# Patient Record
Sex: Male | Born: 1965 | Race: White | Hispanic: No | Marital: Married | Smoking: Never smoker
Health system: Southern US, Community
[De-identification: ages and names within clinical notes are randomized; demographics above are authoritative.]

## PROBLEM LIST (undated history)

## (undated) DIAGNOSIS — E119 Type 2 diabetes mellitus without complications: Secondary | ICD-10-CM

## (undated) DIAGNOSIS — E7800 Pure hypercholesterolemia, unspecified: Secondary | ICD-10-CM

## (undated) HISTORY — PX: JOINT REPLACEMENT: SHX530

---

## 2014-04-28 ENCOUNTER — Emergency Department (HOSPITAL_BASED_OUTPATIENT_CLINIC_OR_DEPARTMENT_OTHER)
Admission: EM | Admit: 2014-04-28 | Discharge: 2014-04-28 | Disposition: A | Discharge status: Home / Self Care | Payer: BLUE CROSS/BLUE SHIELD | Attending: Emergency Medicine | Admitting: Emergency Medicine

## 2014-04-28 DIAGNOSIS — Y998 Other external cause status: Secondary | ICD-10-CM | POA: Diagnosis not present

## 2014-04-28 DIAGNOSIS — Z79899 Other long term (current) drug therapy: Secondary | ICD-10-CM | POA: Insufficient documentation

## 2014-04-28 DIAGNOSIS — Z794 Long term (current) use of insulin: Secondary | ICD-10-CM | POA: Diagnosis not present

## 2014-04-28 DIAGNOSIS — Y9389 Activity, other specified: Secondary | ICD-10-CM | POA: Diagnosis not present

## 2014-04-28 DIAGNOSIS — Y9289 Other specified places as the place of occurrence of the external cause: Secondary | ICD-10-CM | POA: Diagnosis not present

## 2014-04-28 DIAGNOSIS — S0181XA Laceration without foreign body of other part of head, initial encounter: Secondary | ICD-10-CM | POA: Diagnosis not present

## 2014-04-28 MED ORDER — LIDOCAINE-EPINEPHRINE 2 %-1:100000 IJ SOLN
20.0000 mL | Freq: Once | INTRAMUSCULAR | Status: AC
  Administered 2014-04-28: 20 mL via INTRADERMAL

## 2014-04-28 MED ORDER — LIDOCAINE-EPINEPHRINE 1 %-1:100000 IJ SOLN
INTRAMUSCULAR | Status: AC
  Filled 2014-04-28: qty 1

## 2014-04-28 MED ORDER — CEPHALEXIN 500 MG PO CAPS: 500.0000 mg | ORAL_CAPSULE | Freq: Four times a day (QID) | ORAL | Status: DC

## 2014-04-28 NOTE — ED Notes (Signed)
Patient here with laceration to left side of face after brake bar hit patient in head, no loc, no bleeding, arrived with steri-strips to same

## 2014-04-28 NOTE — Discharge Instructions (Signed)
Keep wound dry and do not remove dressing for 24 hours if possible. After that, wash gently morning and night (every 12 hours) with soap and water. Use a topical antibiotic ointment and cover with a bandaid or gauze.    Do NOT use rubbing alcohol or hydrogen peroxide, do not soak the area   Present to your primary care doctor or the urgent care of your choice, or the ED for suture removal in 7-10 days.   Every attempt was made to remove foreign body (contaminants) from the wound.  However, there is always a chance that some may remain in the wound. This can  increase your risk of infection.   If you see signs of infection (warmth, redness, tenderness, pus, sharp increase in pain, fever Facial Laceration  A facial laceration is a cut on the face. These injuries can be painful and cause bleeding. Lacerations usually heal quickly, but they need special care to reduce scarring. DIAGNOSIS  Your health care provider will take a medical history, ask for details about how the injury occurred, and examine the wound to determine how deep the cut is. TREATMENT  Some facial lacerations may not require closure. Others may not be able to be closed because of an increased risk of infection. The risk of infection and the chance for successful closure will depend on various factors, including the amount of time since the injury occurred. The wound may be cleaned to help prevent infection. If closure is appropriate, pain medicines may be given if needed. Your health care provider will use stitches (sutures), wound glue (adhesive), or skin adhesive strips to repair the laceration. These tools bring the skin edges together to allow for faster healing and a better cosmetic outcome. If needed, you may also be given a tetanus shot. HOME CARE INSTRUCTIONS  Only take over-the-counter or prescription medicines as directed by your health care provider.  Follow your health care provider's instructions for wound care. These  instructions will vary depending on the technique used for closing the wound. For Sutures:  Keep the wound clean and dry.   If you were given a bandage (dressing), you should change it at least once a day. Also change the dressing if it becomes wet or dirty, or as directed by your health care provider.   Wash the wound with soap and water 2 times a day. Rinse the wound off with water to remove all soap. Pat the wound dry with a clean towel.   After cleaning, apply a thin layer of the antibiotic ointment recommended by your health care provider. This will help prevent infection and keep the dressing from sticking.   You may shower as usual after the first 24 hours. Do not soak the wound in water until the sutures are removed.   Get your sutures removed as directed by your health care provider. With facial lacerations, sutures should usually be taken out after 4-5 days to avoid stitch marks.   Wait a few days after your sutures are removed before applying any makeup. For Skin Adhesive Strips:  Keep the wound clean and dry.   Do not get the skin adhesive strips wet. You may bathe carefully, using caution to keep the wound dry.   If the wound gets wet, pat it dry with a clean towel.   Skin adhesive strips will fall off on their own. You may trim the strips as the wound heals. Do not remove skin adhesive strips that are still stuck to the  wound. They will fall off in time.  For Wound Adhesive:  You may briefly wet your wound in the shower or bath. Do not soak or scrub the wound. Do not swim. Avoid periods of heavy sweating until the skin adhesive has fallen off on its own. After showering or bathing, gently pat the wound dry with a clean towel.   Do not apply liquid medicine, cream medicine, ointment medicine, or makeup to your wound while the skin adhesive is in place. This may loosen the film before your wound is healed.   If a dressing is placed over the wound, be careful not  to apply tape directly over the skin adhesive. This may cause the adhesive to be pulled off before the wound is healed.   Avoid prolonged exposure to sunlight or tanning lamps while the skin adhesive is in place.  The skin adhesive will usually remain in place for 5-10 days, then naturally fall off the skin. Do not pick at the adhesive film.  After Healing: Once the wound has healed, cover the wound with sunscreen during the day for 1 full year. This can help minimize scarring. Exposure to ultraviolet light in the first year will darken the scar. It can take 1-2 years for the scar to lose its redness and to heal completely.  SEEK IMMEDIATE MEDICAL CARE IF:  You have redness, pain, or swelling around the wound.   You see ayellowish-white fluid (pus) coming from the wound.   You have chills or a fever.  MAKE SURE YOU:  Understand these instructions.  Will watch your condition.  Will get help right away if you are not doing well or get worse. Document Released: 04/22/2004 Document Revised: 01/03/2013 Document Reviewed: 10/26/2012 [facility] Patient Information 2015 [place], [place]. This information is not intended to replace advice given to you by your health care provider. Make sure you discuss any questions you have with your health care provider. , red streaking in the skin) immediately return to the emergency department.   After the wound heals fully, apply sunscreen for 6-12 months to minimize scarring.

## 2014-04-28 NOTE — ED Provider Notes (Signed)
CSN: [id number]     Arrival date & time 04/28/14  1745 History   First MD Initiated Contact with Patient 04/28/14 1802     Chief Complaint  Patient presents with  . laceration to face      (Consider location/radiation/quality/duration/timing/severity/associated sxs/prior Treatment) HPI   Richard Bernard is a 49 y.o. male complaining of laceration to face. He states he was working on a car when a pry bar came off under pressure and hit him with a glancing blow to the left side of his face. He states the metal was clean and not rusty. He denies LOC, blurred vision, pain with eye movement, or tenderness around the orbit. He states that the laceration bled immediately but was controlled with pressure and ice. He attempted to clean the wound with water and an antibiotic ointment. He also applied antibiotic ointment and butterfly bandages before being evaluated. He comes in concerned about cleaning and closure of the wound. He denies any H/A, nausea, vomiting, blurred vision, or dizziness. He states he is UTD on tetanus.  No past medical history on file. No past surgical history on file. No family history on file. History  Substance Use Topics  . Smoking status: Not on file  . Smokeless tobacco: Not on file  . Alcohol Use: Not on file    Review of Systems  10 systems reviewed and found to be negative, except as noted in the HPI.   Allergies  Review of patient's allergies indicates no known allergies.  Home Medications   Prior to Admission medications   Medication Sig Start Date End Date Taking? Authorizing Provider  atorvastatin (LIPITOR) 20 MG tablet Take 20 mg by mouth daily.   Yes Historical Provider, MD  insulin aspart (NOVOLOG) 100 UNIT/ML injection Inject into the skin 3 (three) times daily before meals.   Yes Historical Provider, MD   BP 144/105 mmHg  Pulse 71  Temp(Src) 98.3 F (36.8 C) (Oral)  Resp 18  Wt 170 lb (77.111 kg)  SpO2 98% Physical Exam  Constitutional: He is  oriented to person, place, and time. He appears well-developed and well-nourished. No distress.  HENT:  Head: Normocephalic.    Mouth/Throat: Oropharynx is clear and moist.  Eyes: Conjunctivae and EOM are normal. Pupils are equal, round, and reactive to light.  Neck: Normal range of motion.  Cardiovascular: Normal rate and regular rhythm.   Pulmonary/Chest: Effort normal and breath sounds normal. No stridor.  Abdominal: Soft.  Musculoskeletal: Normal range of motion.  Neurological: He is alert and oriented to person, place, and time.  Psychiatric: He has a normal mood and affect.  Nursing note and vitals reviewed.   ED Course  LACERATION REPAIR Date/Time: 04/28/2014 7:07 PM Performed by: Wynetta Emery Authorized by: Wynetta Emery Consent: Verbal consent obtained. Consent given by: patient Body area: head/neck Location details: forehead Laceration length: 1.5 cm Preparation: Patient was prepped and draped in the usual sterile fashion. Irrigation solution: saline Irrigation method: syringe Amount of cleaning: extensive Debridement: none Degree of undermining: none Wound skin closure material used: 7-0 Ethilon. Number of sutures: 4 Technique: simple Approximation: close Approximation difficulty: simple Patient tolerance: Patient tolerated the procedure well with no immediate complications   (including critical care time) Labs Review Labs Reviewed - No data to display  Imaging Review No results found.   EKG Interpretation None      MDM   Final diagnoses:  Facial laceration, initial encounter    Filed Vitals:   04/28/14 1759  BP:  144/105  Pulse: 71  Temp: 98.3 F (36.8 C)  TempSrc: Oral  Resp: 18  Weight: 170 lb (77.111 kg)  SpO2: 98%    Medications  lidocaine-EPINEPHrine (XYLOCAINE W/EPI) 2 %-1:100000 (with pres) injection 20 mL (20 mLs Intradermal Given by Other 04/28/14 1830)  lidocaine-EPINEPHrine (XYLOCAINE W/EPI) 1 %-1:100000 (with pres)  injection (  Duplicate 04/28/14 1845)    Graylin ShiverScott Guida is a pleasant 49 y.o. male presenting with laceration to left temple. No signs of orbital fracture. Wound is irrigated and closed. Patient is insulin dependent diabetic, will start him on Keflex prophylactically.  Evaluation does not show pathology that would require ongoing emergent intervention or inpatient treatment. Pt is hemodynamically stable and mentating appropriately. Discussed findings and plan with patient/guardian, who agrees with care plan. All questions answered. Return precautions discussed and outpatient follow up given.   Discharge Medication List as of 04/28/2014  7:22 PM    START taking these medications   Details  cephALEXin (KEFLEX) 500 MG capsule Take 1 capsule (500 mg total) by mouth 4 (four) times daily., Starting 04/28/2014, Until Discontinued, Print             Wynetta Emeryicole Janis Sol, PA-C 04/29/14 1846  Merrie RoofJohn David Wofford III, MD 05/02/14 [phone]

## 2014-08-23 ENCOUNTER — Emergency Department (HOSPITAL_BASED_OUTPATIENT_CLINIC_OR_DEPARTMENT_OTHER)
Admission: EM | Admit: 2014-08-23 | Discharge: 2014-08-23 | Disposition: A | Discharge status: Home / Self Care | Payer: BLUE CROSS/BLUE SHIELD | Attending: Emergency Medicine | Admitting: Emergency Medicine

## 2014-08-23 ENCOUNTER — Emergency Department (HOSPITAL_BASED_OUTPATIENT_CLINIC_OR_DEPARTMENT_OTHER): Payer: BLUE CROSS/BLUE SHIELD

## 2014-08-23 ENCOUNTER — Encounter (HOSPITAL_BASED_OUTPATIENT_CLINIC_OR_DEPARTMENT_OTHER): Payer: Self-pay | Admitting: *Deleted

## 2014-08-23 DIAGNOSIS — S63502A Unspecified sprain of left wrist, initial encounter: Secondary | ICD-10-CM | POA: Insufficient documentation

## 2014-08-23 DIAGNOSIS — Y9239 Other specified sports and athletic area as the place of occurrence of the external cause: Secondary | ICD-10-CM | POA: Diagnosis not present

## 2014-08-23 DIAGNOSIS — E119 Type 2 diabetes mellitus without complications: Secondary | ICD-10-CM | POA: Insufficient documentation

## 2014-08-23 DIAGNOSIS — X58XXXA Exposure to other specified factors, initial encounter: Secondary | ICD-10-CM | POA: Insufficient documentation

## 2014-08-23 DIAGNOSIS — S6992XA Unspecified injury of left wrist, hand and finger(s), initial encounter: Secondary | ICD-10-CM | POA: Diagnosis present

## 2014-08-23 DIAGNOSIS — Z79899 Other long term (current) drug therapy: Secondary | ICD-10-CM | POA: Insufficient documentation

## 2014-08-23 DIAGNOSIS — Z794 Long term (current) use of insulin: Secondary | ICD-10-CM | POA: Insufficient documentation

## 2014-08-23 DIAGNOSIS — Y9364 Activity, baseball: Secondary | ICD-10-CM | POA: Diagnosis not present

## 2014-08-23 DIAGNOSIS — S60512A Abrasion of left hand, initial encounter: Secondary | ICD-10-CM | POA: Insufficient documentation

## 2014-08-23 DIAGNOSIS — Y998 Other external cause status: Secondary | ICD-10-CM | POA: Insufficient documentation

## 2014-08-23 HISTORY — DX: Type 2 diabetes mellitus without complications: E11.9

## 2014-08-23 NOTE — Discharge Instructions (Signed)
Joint Sprain °A sprain is a tear or stretch in the ligaments that hold a joint together. Severe sprains may need as long as 3-6 weeks of immobilization and/or exercises to heal completely. Sprained joints should be rested and protected. If not, they can become unstable and prone to re-injury. Proper treatment can reduce your pain, shorten the period of disability, and reduce the risk of repeated injuries. °TREATMENT  °· Rest and elevate the injured joint to reduce pain and swelling. °· Apply ice packs to the injury for 20-30 minutes every 2-3 hours for the next 2-3 days. °· Keep the injury wrapped in a compression bandage or splint as long as the joint is painful or as instructed by your caregiver. °· Do not use the injured joint until it is completely healed to prevent re-injury and chronic instability. Follow the instructions of your caregiver. °· Long-term sprain management may require exercises and/or treatment by a physical therapist. Taping or special braces may help stabilize the joint until it is completely better. °SEEK MEDICAL CARE IF:  °· You develop increased pain or swelling of the joint. °· You develop increasing redness and warmth of the joint. °· You develop a fever. °· It becomes stiff. °· Your hand or foot gets cold or numb. °Document Released: 04/22/2004 Document Revised: 06/07/2011 Document Reviewed: 04/01/2008 °[employer]® Patient Information ©2015 [employer]. This information is not intended to replace advice given to you by your health care provider. Make sure you discuss any questions you have with your health care provider. ° °

## 2014-08-23 NOTE — ED Provider Notes (Signed)
CSN: [id number]     Arrival date & time 08/23/14  0907 History   First MD Initiated Contact with Patient 08/23/14 [phone]     Chief Complaint  Patient presents with  . Wrist Pain     (Consider location/radiation/quality/duration/timing/severity/associated sxs/prior Treatment) Patient is a 49 y.o. male presenting with wrist pain. The history is provided by the patient.  Wrist Pain This is a new problem. The current episode started yesterday. The problem occurs constantly. The problem has not changed since onset.Pertinent negatives include no chest pain, no abdominal pain, no headaches and no shortness of breath. Exacerbated by: palpation. The symptoms are relieved by NSAIDs. He has tried a cold compress and rest (ibuprofen) for the symptoms. The treatment provided moderate relief.    Past Medical History  Diagnosis Date  . Diabetes mellitus without complication    Past Surgical History  Procedure Laterality Date  . Joint replacement     History reviewed. No pertinent family history. History  Substance Use Topics  . Smoking status: Never Smoker   . Smokeless tobacco: Not on file  . Alcohol Use: No    Review of Systems  Constitutional: Negative for fever.  HENT: Negative for drooling and rhinorrhea.   Eyes: Negative for pain.  Respiratory: Negative for cough and shortness of breath.   Cardiovascular: Negative for chest pain and leg swelling.  Gastrointestinal: Negative for nausea, vomiting, abdominal pain and diarrhea.  Genitourinary: Negative for dysuria and hematuria.  Musculoskeletal: Negative for gait problem and neck pain.  Skin: Negative for color change.  Neurological: Negative for numbness and headaches.  Hematological: Negative for adenopathy.  Psychiatric/Behavioral: Negative for behavioral problems.  All other systems reviewed and are negative.     Allergies  Review of patient's allergies indicates no known allergies.  Home Medications   Prior to Admission  medications   Medication Sig Start Date End Date Taking? Authorizing Provider  atorvastatin (LIPITOR) 20 MG tablet Take 20 mg by mouth daily.    Historical Provider, MD  insulin aspart (NOVOLOG) 100 UNIT/ML injection Inject into the skin 3 (three) times daily before meals.    Historical Provider, MD   BP 123/80 mmHg  Pulse 75  Temp(Src) 98.3 F (36.8 C) (Oral)  Resp 18  Ht  (1.778 m)  Wt 174 lb (78.926 kg)  BMI 24.97 kg/m2  SpO2 100% Physical Exam  Constitutional: He is oriented to person, place, and time. He appears well-developed and well-nourished.  HENT:  Head: Normocephalic and atraumatic.  Right Ear: External ear normal.  Left Ear: External ear normal.  Nose: Nose normal.  Mouth/Throat: Oropharynx is clear and moist. No oropharyngeal exudate.  Eyes: Conjunctivae and EOM are normal. Pupils are equal, round, and reactive to light.  Neck: Normal range of motion. Neck supple.  Cardiovascular: Normal rate, regular rhythm, normal heart sounds and intact distal pulses.  Exam reveals no gallop and no friction rub.   No murmur heard. Pulmonary/Chest: Effort normal and breath sounds normal. No respiratory distress. He has no wheezes.  Abdominal: Soft. Bowel sounds are normal. He exhibits no distension. There is no tenderness. There is no rebound and no guarding.  Musculoskeletal: Normal range of motion. He exhibits tenderness. He exhibits no edema.  Mild abrasion to the dorsal aspect of the left hand.  Normal range of motion of the left wrist. No focal tenderness of the left wrist.  2+ distal radial and ulnar pulses in the left upper extremity.  Normal motor skills of the left  hand.  Normal sensation in the left upper extremity.  Focal tenderness of the dorsal aspect of the mid hand. Mild swelling in this area. No snuffbox tenderness.  Neurological: He is alert and oriented to person, place, and time.  Skin: Skin is warm and dry.  Psychiatric: He has a normal mood and  affect. His behavior is normal.  Nursing note and vitals reviewed.   ED Course  Procedures (including critical care time) Labs Review Labs Reviewed - No data to display  Imaging Review Dg Wrist Complete Left  08/23/2014   CLINICAL DATA:  Larey SeatFell last night playing softball. Posterior swelling. Ulnar pain. Initial encounter.  EXAM: LEFT WRIST - COMPLETE 3+ VIEW  COMPARISON:  None.  FINDINGS: Scaphoid intact. Lucencies within the carpal bones are likely related to subchondral small ossific density projecting adjacent to the distal ulna. No donor site is identified. Suspect dorsal soft tissue swelling on the lateral view.  IMPRESSION: Small ossific density distal to the distal ulna. No donor site identified. Favor to be related to an accessory ossicle or less likely remote trauma.  Lucencies about the carpal bonesare likely related to subchondral cysts of osteoarthritis. Relatively mild joint space narrowing.   Electronically Signed   By: Jeronimo GreavesKyle  Talbot M.D.   On: 08/23/2014 09:40     EKG Interpretation None      MDM   Final diagnoses:  Left wrist sprain, initial encounter    9:36 AM 49 y.o. male who presents with left hand pain which began last night after diving for a ball during a softball match. He is neurovascularly intact on my exam. We'll get screening plain film. Pain controlled with ibuprofen at home.  10:04 AM: Likely wrist sprain. Imaging reviewed w/ pt. we'll place in removable wrist splint for comfort. Will recommend RICE. I have discussed the diagnosis/risks/treatment options with the patient and believe the pt to be eligible for discharge home to follow-up with his pcp as needed. We also discussed returning to the ED immediately if new or worsening sx occur. We discussed the sx which are most concerning (e.g., worsening pain) that necessitate immediate return. Medications administered to the patient during their visit and any new prescriptions provided to the patient are listed  below.  Medications given during this visit Medications - No data to display  New Prescriptions   No medications on file     Purvis SheffieldForrest Pallie Swigert, MD 08/23/14 1005

## 2014-08-23 NOTE — ED Notes (Signed)
Pt c/o left wrist injury x 1 day ago  

## 2015-03-02 ENCOUNTER — Emergency Department (HOSPITAL_BASED_OUTPATIENT_CLINIC_OR_DEPARTMENT_OTHER): Payer: Worker's Compensation

## 2015-03-02 ENCOUNTER — Emergency Department (HOSPITAL_BASED_OUTPATIENT_CLINIC_OR_DEPARTMENT_OTHER)
Admission: EM | Admit: 2015-03-02 | Discharge: 2015-03-02 | Disposition: A | Discharge status: Home / Self Care | Payer: Worker's Compensation | Attending: Emergency Medicine | Admitting: Emergency Medicine

## 2015-03-02 ENCOUNTER — Encounter (HOSPITAL_BASED_OUTPATIENT_CLINIC_OR_DEPARTMENT_OTHER): Payer: Self-pay | Admitting: Emergency Medicine

## 2015-03-02 DIAGNOSIS — S62631B Displaced fracture of distal phalanx of left index finger, initial encounter for open fracture: Secondary | ICD-10-CM | POA: Insufficient documentation

## 2015-03-02 DIAGNOSIS — Z23 Encounter for immunization: Secondary | ICD-10-CM | POA: Diagnosis not present

## 2015-03-02 DIAGNOSIS — S61310A Laceration without foreign body of right index finger with damage to nail, initial encounter: Secondary | ICD-10-CM | POA: Diagnosis not present

## 2015-03-02 DIAGNOSIS — Y9389 Activity, other specified: Secondary | ICD-10-CM | POA: Diagnosis not present

## 2015-03-02 DIAGNOSIS — E119 Type 2 diabetes mellitus without complications: Secondary | ICD-10-CM | POA: Insufficient documentation

## 2015-03-02 DIAGNOSIS — S62639B Displaced fracture of distal phalanx of unspecified finger, initial encounter for open fracture: Secondary | ICD-10-CM

## 2015-03-02 DIAGNOSIS — Y998 Other external cause status: Secondary | ICD-10-CM | POA: Diagnosis not present

## 2015-03-02 DIAGNOSIS — Z79899 Other long term (current) drug therapy: Secondary | ICD-10-CM | POA: Diagnosis not present

## 2015-03-02 DIAGNOSIS — W270XXA Contact with workbench tool, initial encounter: Secondary | ICD-10-CM | POA: Insufficient documentation

## 2015-03-02 DIAGNOSIS — S61210A Laceration without foreign body of right index finger without damage to nail, initial encounter: Secondary | ICD-10-CM | POA: Diagnosis present

## 2015-03-02 DIAGNOSIS — Y9289 Other specified places as the place of occurrence of the external cause: Secondary | ICD-10-CM | POA: Diagnosis not present

## 2015-03-02 DIAGNOSIS — E7800 Pure hypercholesterolemia, unspecified: Secondary | ICD-10-CM | POA: Diagnosis not present

## 2015-03-02 DIAGNOSIS — Z794 Long term (current) use of insulin: Secondary | ICD-10-CM | POA: Diagnosis not present

## 2015-03-02 DIAGNOSIS — S61219A Laceration without foreign body of unspecified finger without damage to nail, initial encounter: Secondary | ICD-10-CM

## 2015-03-02 HISTORY — DX: Pure hypercholesterolemia, unspecified: E78.00

## 2015-03-02 MED ORDER — LIDOCAINE HCL (PF) 1 % IJ SOLN
10.0000 mL | Freq: Once | INTRAMUSCULAR | Status: AC
  Administered 2015-03-02: 10 mL via INTRADERMAL
  Filled 2015-03-02: qty 10

## 2015-03-02 MED ORDER — IBUPROFEN 800 MG PO TABS: 800.0000 mg | ORAL_TABLET | Freq: Three times a day (TID) | ORAL | Status: AC

## 2015-03-02 MED ORDER — LIDOCAINE HCL (PF) 1 % IJ SOLN
INTRAMUSCULAR | Status: AC
  Filled 2015-03-02: qty 5

## 2015-03-02 MED ORDER — TETANUS-DIPHTH-ACELL PERTUSSIS 5-2.5-18.5 LF-MCG/0.5 IM SUSP
0.5000 mL | Freq: Once | INTRAMUSCULAR | Status: AC
  Administered 2015-03-02: 0.5 mL via INTRAMUSCULAR
  Filled 2015-03-02: qty 0.5

## 2015-03-02 MED ORDER — HYDROCODONE-ACETAMINOPHEN 5-325 MG PO TABS: 2.0000 | ORAL_TABLET | ORAL | Status: AC | PRN

## 2015-03-02 MED ORDER — AMOXICILLIN-POT CLAVULANATE 875-125 MG PO TABS
1.0000 | ORAL_TABLET | Freq: Once | ORAL | Status: AC
  Administered 2015-03-02: 1 via ORAL
  Filled 2015-03-02: qty 1

## 2015-03-02 MED ORDER — AMOXICILLIN-POT CLAVULANATE 875-125 MG PO TABS: 1.0000 | ORAL_TABLET | Freq: Two times a day (BID) | ORAL | Status: AC

## 2015-03-02 MED ORDER — BACITRACIN ZINC 500 UNIT/GM EX OINT: 1.0000 "application " | TOPICAL_OINTMENT | Freq: Two times a day (BID) | CUTANEOUS | Status: AC

## 2015-03-02 MED ORDER — BACITRACIN ZINC 500 UNIT/GM EX OINT
1.0000 "application " | TOPICAL_OINTMENT | Freq: Two times a day (BID) | CUTANEOUS | Status: DC
  Administered 2015-03-02: 1 via TOPICAL

## 2015-03-02 NOTE — Discharge Instructions (Signed)
You have a fracture of your finger - it is exposed through the laceration in the skin Keep the wound clean and dry - you can use topical antibiotic ointment, non stick gauze, topped with sterile gauze as we have done tonight - additionally, take Augmentin twice daily for 7 days and follow up with the hand surgeon early this coming week.  This wound will probably bleeding some tonight - if it continues bleeding heavily tomorrow, you may need tor return to have it redressed.  Please obtain all of your results from medical records or have your doctors office obtain the results - share them with your doctor - you should be seen at your doctors office in the next 2 days. Call today to arrange your follow up. Take the medications as prescribed. Please review all of the medicines and only take them if you do not have an allergy to them. Please be aware that if you are taking birth control pills, taking other prescriptions, ESPECIALLY ANTIBIOTICS may make the birth control ineffective - if this is the case, either do not engage in sexual activity or use alternative methods of birth control such as condoms until you have finished the medicine and your family doctor says it is OK to restart them. If you are on a blood thinner such as COUMADIN, be aware that any other medicine that you take may cause the coumadin to either work too much, or not enough - you should have your coumadin level rechecked in next 7 days if this is the case.  ?  It is also a possibility that you have an allergic reaction to any of the medicines that you have been prescribed - Everybody reacts differently to medications and while MOST people have no trouble with most medicines, you may have a reaction such as nausea, vomiting, rash, swelling, shortness of breath. If this is the case, please stop taking the medicine immediately and contact your physician.  ?  You should return to the ER if you develop severe or worsening symptoms.

## 2015-03-02 NOTE — ED Notes (Signed)
MD at bedside. 

## 2015-03-02 NOTE — ED Provider Notes (Signed)
CSN: [id number]     Arrival date & time 03/02/15  0101 History   First MD Initiated Contact with Patient 03/02/15 0114     Chief Complaint  Patient presents with  . Extremity Laceration     (Consider location/radiation/quality/duration/timing/severity/associated sxs/prior Treatment) HPI Comments: The patient is a 49 year old male, he has a known history of diabetes and hypercholesterolemia. He presents after injuring his finger on a table saw. His finger, left, index, was injured on the palmar and ulnar surface, it does involve the lateral surface of the nail. This occurred acutely, it occurred just prior to arrival, the pain is constant, worse with palpation, associated with a small amount of bleeding.  The history is provided by the patient.    Past Medical History  Diagnosis Date  . Diabetes mellitus without complication (HCC)   . Hypercholesterolemia    Past Surgical History  Procedure Laterality Date  . Joint replacement     History reviewed. No pertinent family history. Social History  Substance Use Topics  . Smoking status: Never Smoker   . Smokeless tobacco: None  . Alcohol Use: No    Review of Systems  Constitutional: Negative for fever.  Gastrointestinal: Negative for vomiting.  Skin: Positive for wound.       Laceration  Neurological: Negative for weakness and numbness.      Allergies  Review of patient's allergies indicates no known allergies.  Home Medications   Prior to Admission medications   Medication Sig Start Date End Date Taking? Authorizing Provider  amoxicillin-clavulanate (AUGMENTIN) 875-125 MG tablet Take 1 tablet by mouth every 12 (twelve) hours. 03/02/15   Eber Hong, MD  atorvastatin (LIPITOR) 20 MG tablet Take 20 mg by mouth daily.    Historical Provider, MD  bacitracin ointment Apply 1 application topically 2 (two) times daily. 03/02/15   Eber Hong, MD  HYDROcodone-acetaminophen (NORCO/VICODIN) 5-325 MG tablet Take 2 tablets by mouth  every 4 (four) hours as needed. 03/02/15   Eber Hong, MD  ibuprofen (ADVIL,MOTRIN) 800 MG tablet Take 1 tablet (800 mg total) by mouth 3 (three) times daily. 03/02/15   Eber Hong, MD  insulin aspart (NOVOLOG) 100 UNIT/ML injection Inject into the skin 3 (three) times daily before meals.    Historical Provider, MD   BP 133/88 mmHg  Pulse 85  Temp(Src) 98.3 F (36.8 C) (Oral)  Resp 18  Ht  (1.778 m)  Wt 175 lb (79.379 kg)  BMI 25.11 kg/m2  SpO2 100% Physical Exam  Constitutional: He appears well-developed and well-nourished. No distress.  HENT:  Head: Normocephalic.  Eyes: Conjunctivae are normal. No scleral icterus.  Cardiovascular: Normal rate and regular rhythm.   Pulmonary/Chest: Effort normal and breath sounds normal.  Musculoskeletal: Normal range of motion. He exhibits tenderness ( ttp over the laceration site ). He exhibits no edema.  Neurological: He is alert. Coordination normal.  Sensation and motor intact  Skin: Skin is warm and dry. He is not diaphoretic.  Laceration over the lateral index finger on the left, stellate, macerated, involves the lateral nail but not the nail matrix    ED Course  Procedures (including critical care time) Labs Review Labs Reviewed - No data to display  Imaging Review Dg Finger Index Left  03/02/2015  CLINICAL DATA:  Table saw injury EXAM: LEFT INDEX FINGER 2+V COMPARISON:  None. FINDINGS: There is laceration of the bone and soft tissues at the distal phalangeal tuft. No metallic foreign body. IMPRESSION: Soft tissue laceration with underlying bony  injury, second distal phalangeal tuft. Electronically Signed   By: Ellery Plunkaniel R Mitchell M.D.   On: 03/02/2015 01:57   I have personally reviewed and evaluated these images and lab results as part of my medical decision-making.    MDM   Final diagnoses:  Finger laceration, initial encounter  Open fracture of tuft of distal phalanx of finger, initial encounter    Wound explored in a  bloodless field - irrigated copiously with sterile saline, debrided of broken nail and dead tissue - there is a large defect of skin in the middle of the wound that is preventing closure of the wound - he has some exposed bone that is palpated though not visualized due to overlying tissue - he has normal VS, xray shows small frx fragments - pt informed of all results and the tx plan and is in agreement -   Augmentin Motrin vicodin Bacitracin Wound cleaned and dressed prior to d/c.  Meds given in ED:  Medications  lidocaine (PF) (XYLOCAINE) 1 % injection 10 mL (not administered)  lidocaine (PF) (XYLOCAINE) 1 % injection (not administered)  bacitracin ointment 1 application (not administered)  amoxicillin-clavulanate (AUGMENTIN) 875-125 MG per tablet 1 tablet (not administered)  Tdap (BOOSTRIX) injection 0.5 mL (0.5 mLs Intramuscular Given 03/02/15 0140)    New Prescriptions   AMOXICILLIN-CLAVULANATE (AUGMENTIN) 875-125 MG TABLET    Take 1 tablet by mouth every 12 (twelve) hours.   BACITRACIN OINTMENT    Apply 1 application topically 2 (two) times daily.   HYDROCODONE-ACETAMINOPHEN (NORCO/VICODIN) 5-325 MG TABLET    Take 2 tablets by mouth every 4 (four) hours as needed.   IBUPROFEN (ADVIL,MOTRIN) 800 MG TABLET    Take 1 tablet (800 mg total) by mouth 3 (three) times daily.        Eber HongBrian Julyana Woolverton, MD 03/02/15 [phone]

## 2015-03-02 NOTE — ED Notes (Signed)
Patient states that he cut his left index finger on a table saw

## 2016-02-25 IMAGING — DX DG WRIST COMPLETE 3+V*L*
4 series · 4 of 4 positions shown · non-contrast
Comparison: None.

CLINICAL DATA: Fell last night playing softball. Posterior
swelling. Ulnar pain. Initial encounter.

EXAM:
LEFT WRIST - COMPLETE 3+ VIEW

[wrist pa]
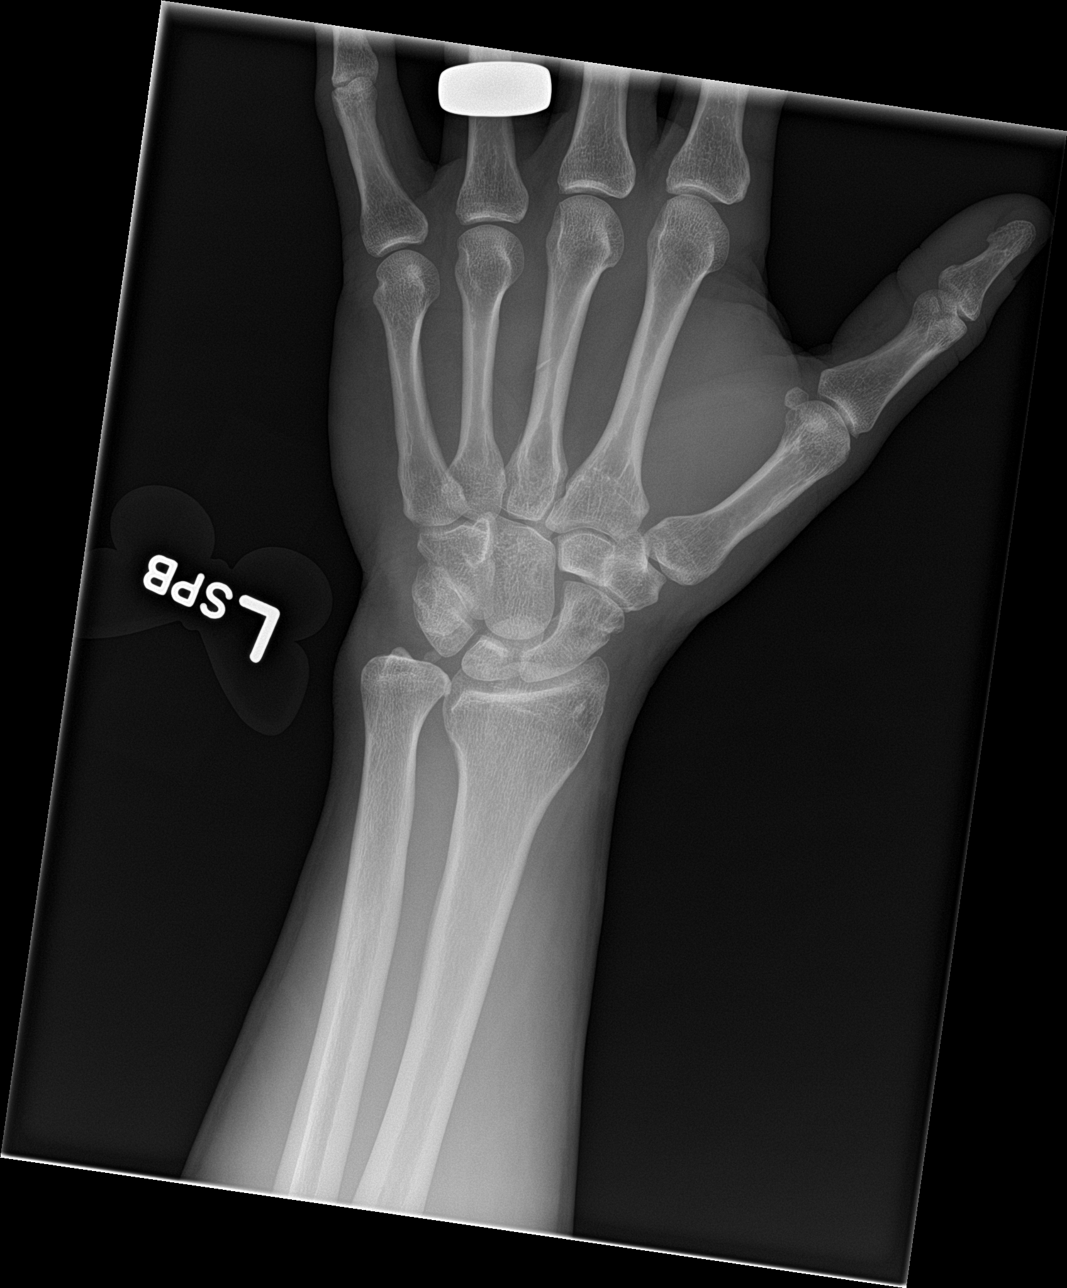

[wrist obl]
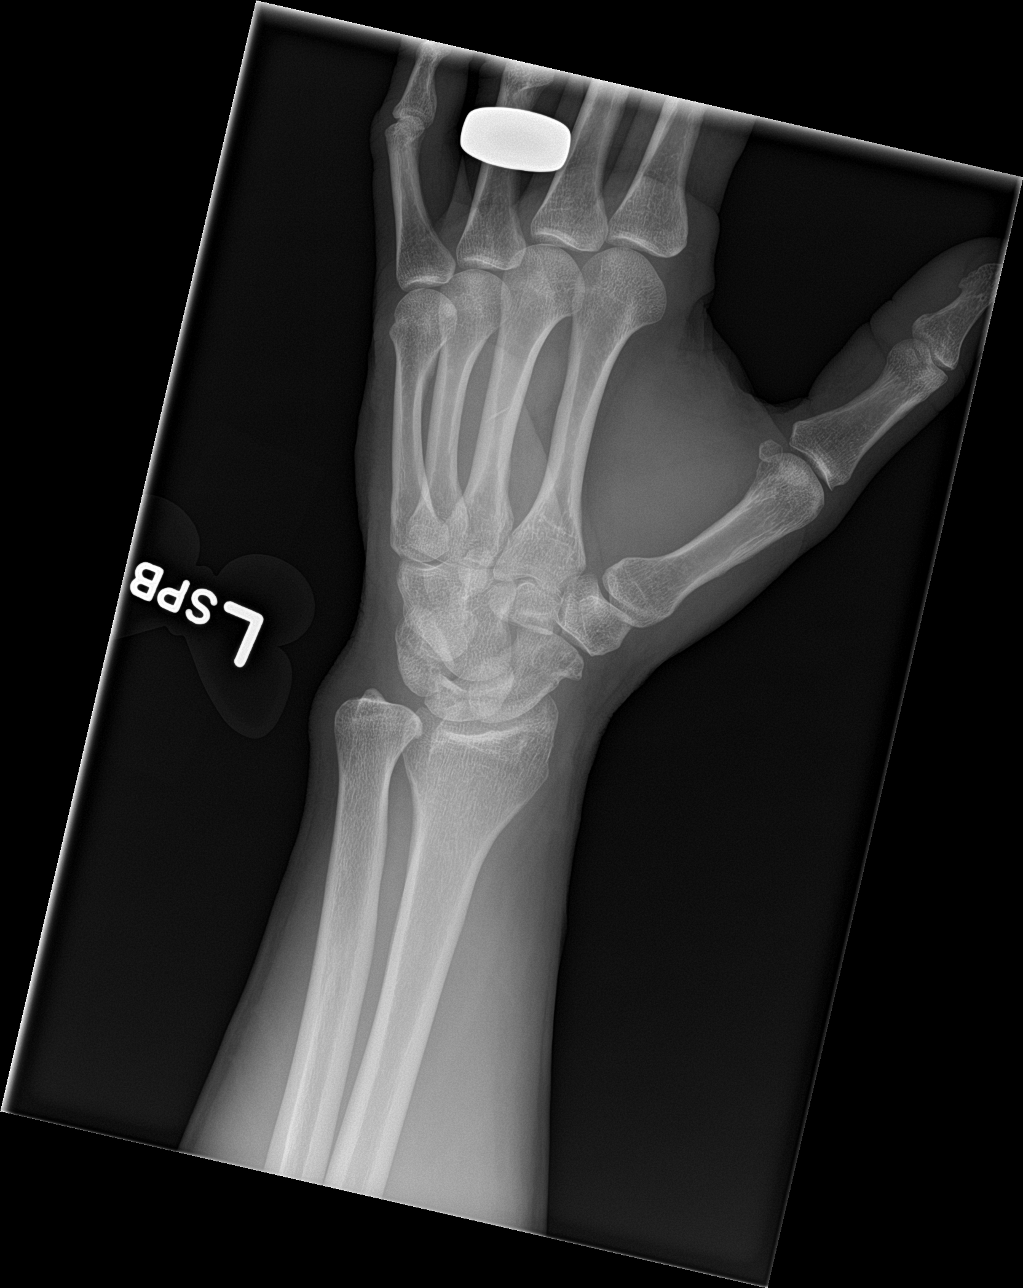

[wrist lat]
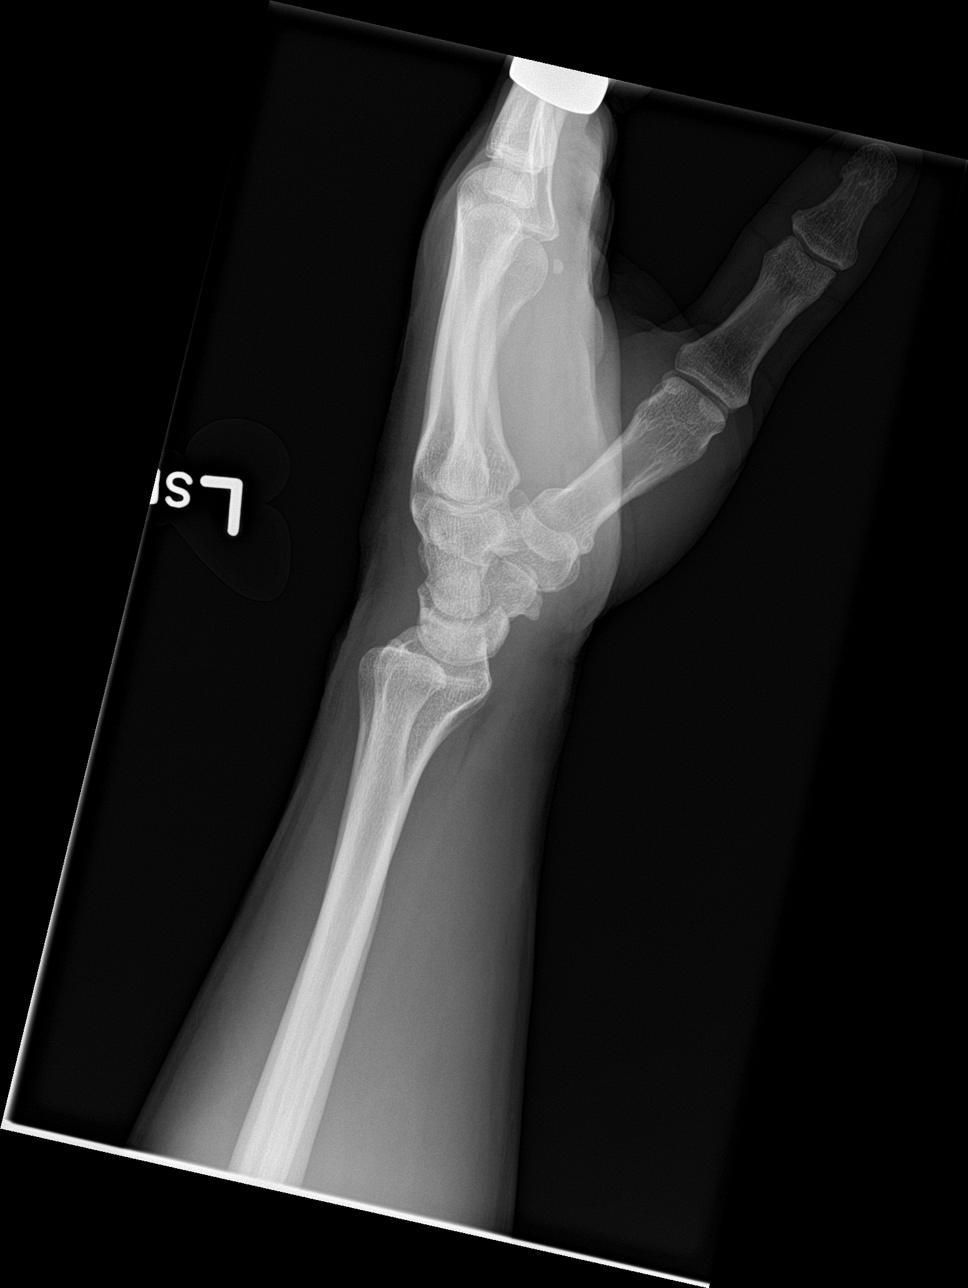

[wrist navicular]
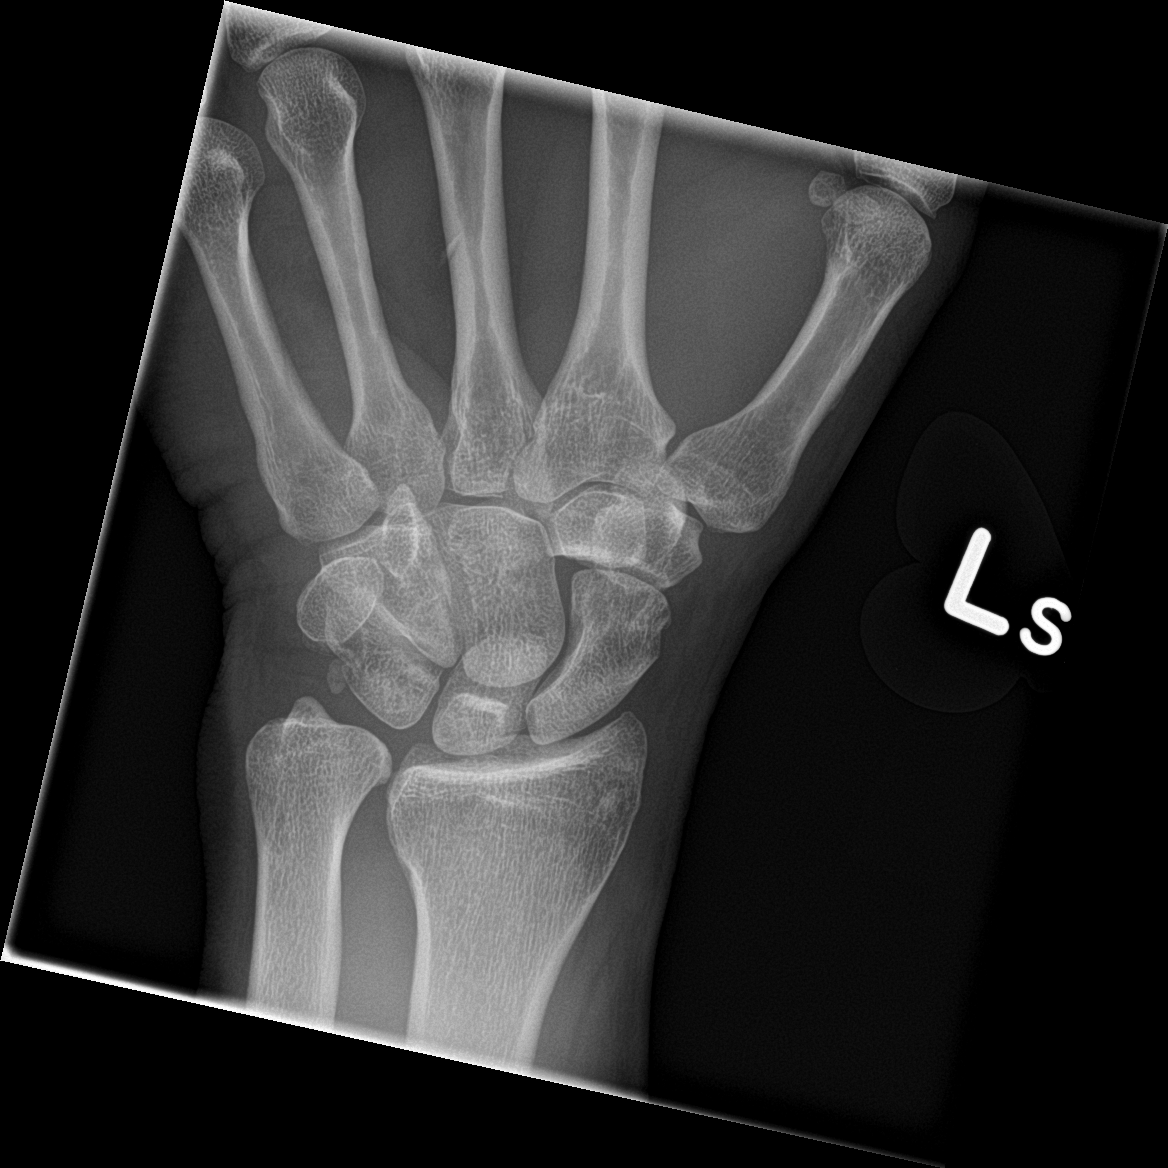

[4 of 4 positions shown; findings below may reference images not displayed]

FINDINGS: Scaphoid intact. Lucencies within the carpal bones are likely
related to subchondral small ossific density projecting adjacent to
the distal ulna. No donor site is identified. Suspect dorsal soft
tissue swelling on the lateral view.
IMPRESSION: Small ossific density distal to the distal ulna. No donor site
identified. Favor to be related to an accessory ossicle or less
likely remote trauma.

Lucencies about the carpal bonesare likely related to subchondral
cysts of osteoarthritis. Relatively mild joint space narrowing.

## 2016-09-03 IMAGING — CR DG FINGER INDEX 2+V*L*
3 series · 3 of 3 positions shown · non-contrast
Comparison: None.

CLINICAL DATA: Table saw injury

EXAM:
LEFT INDEX FINGER 2+V

[x finger pa left]
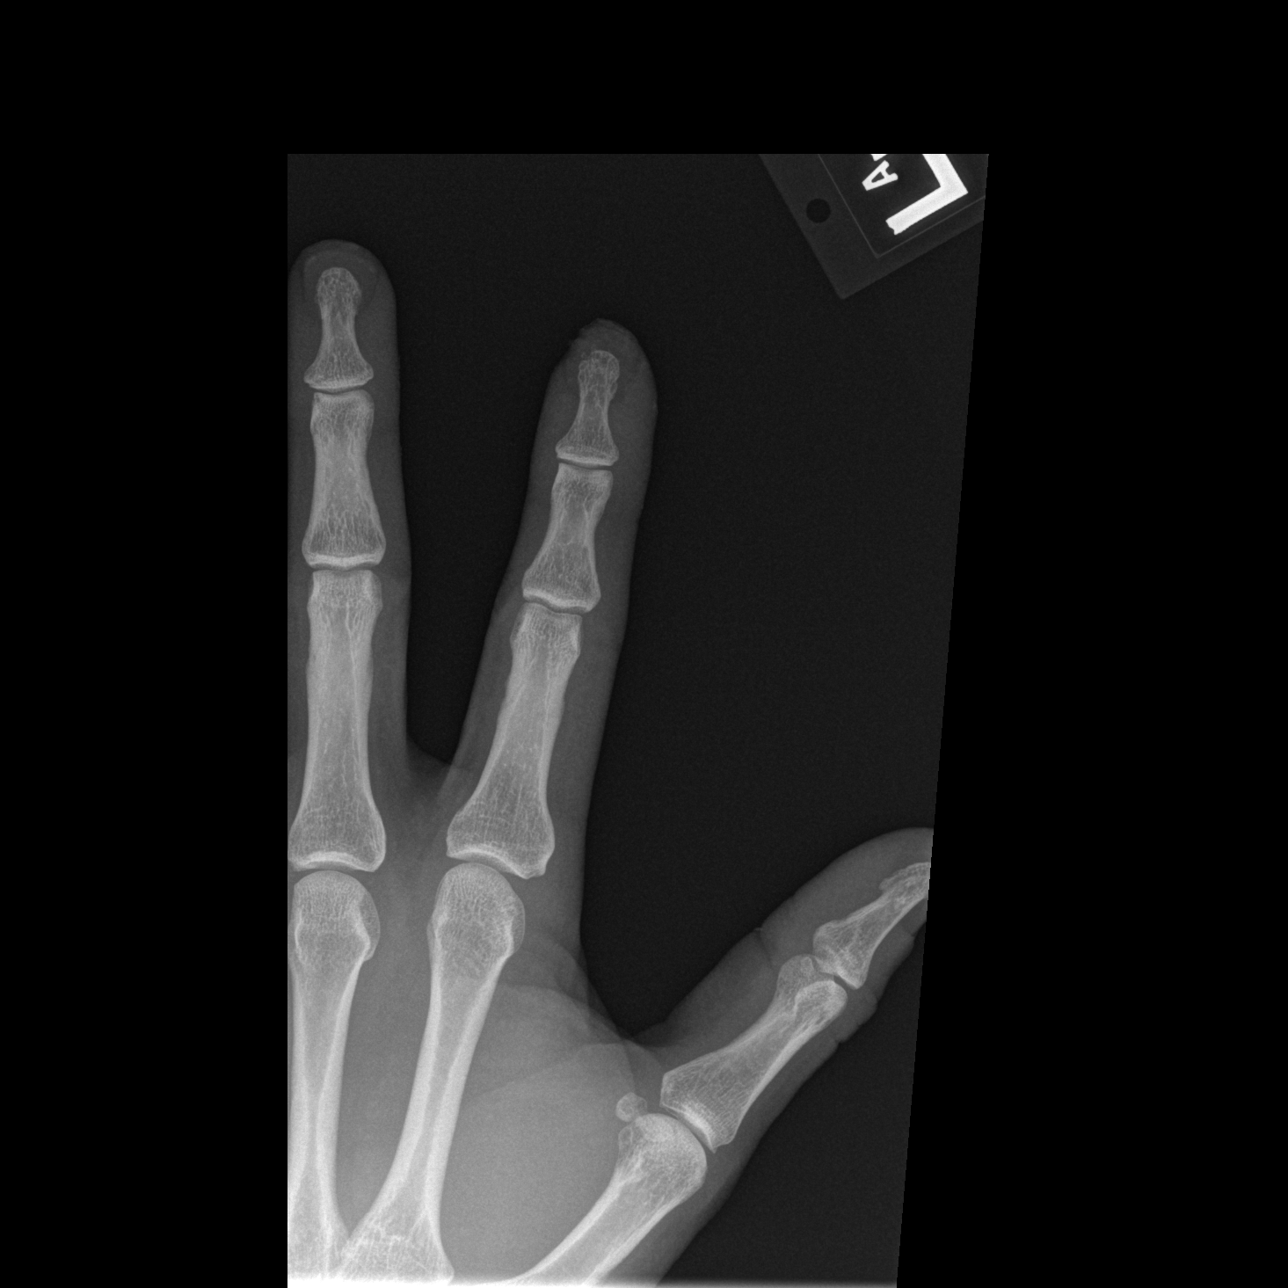

[x finger obl. left]
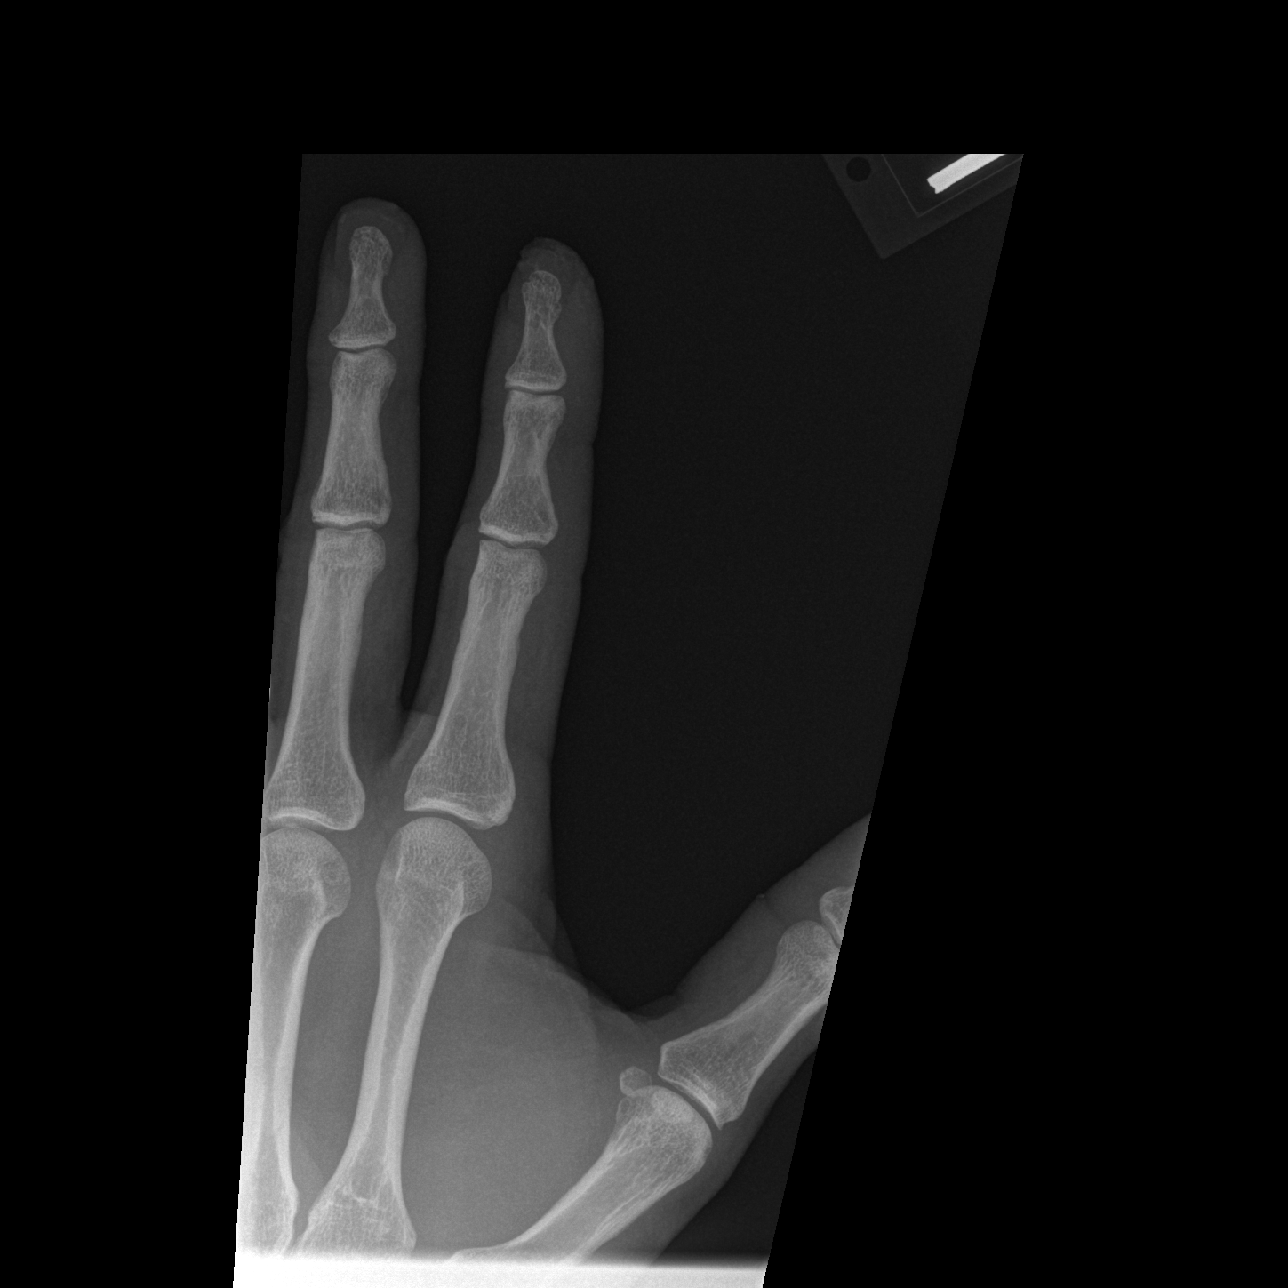

[x finger lateral left]
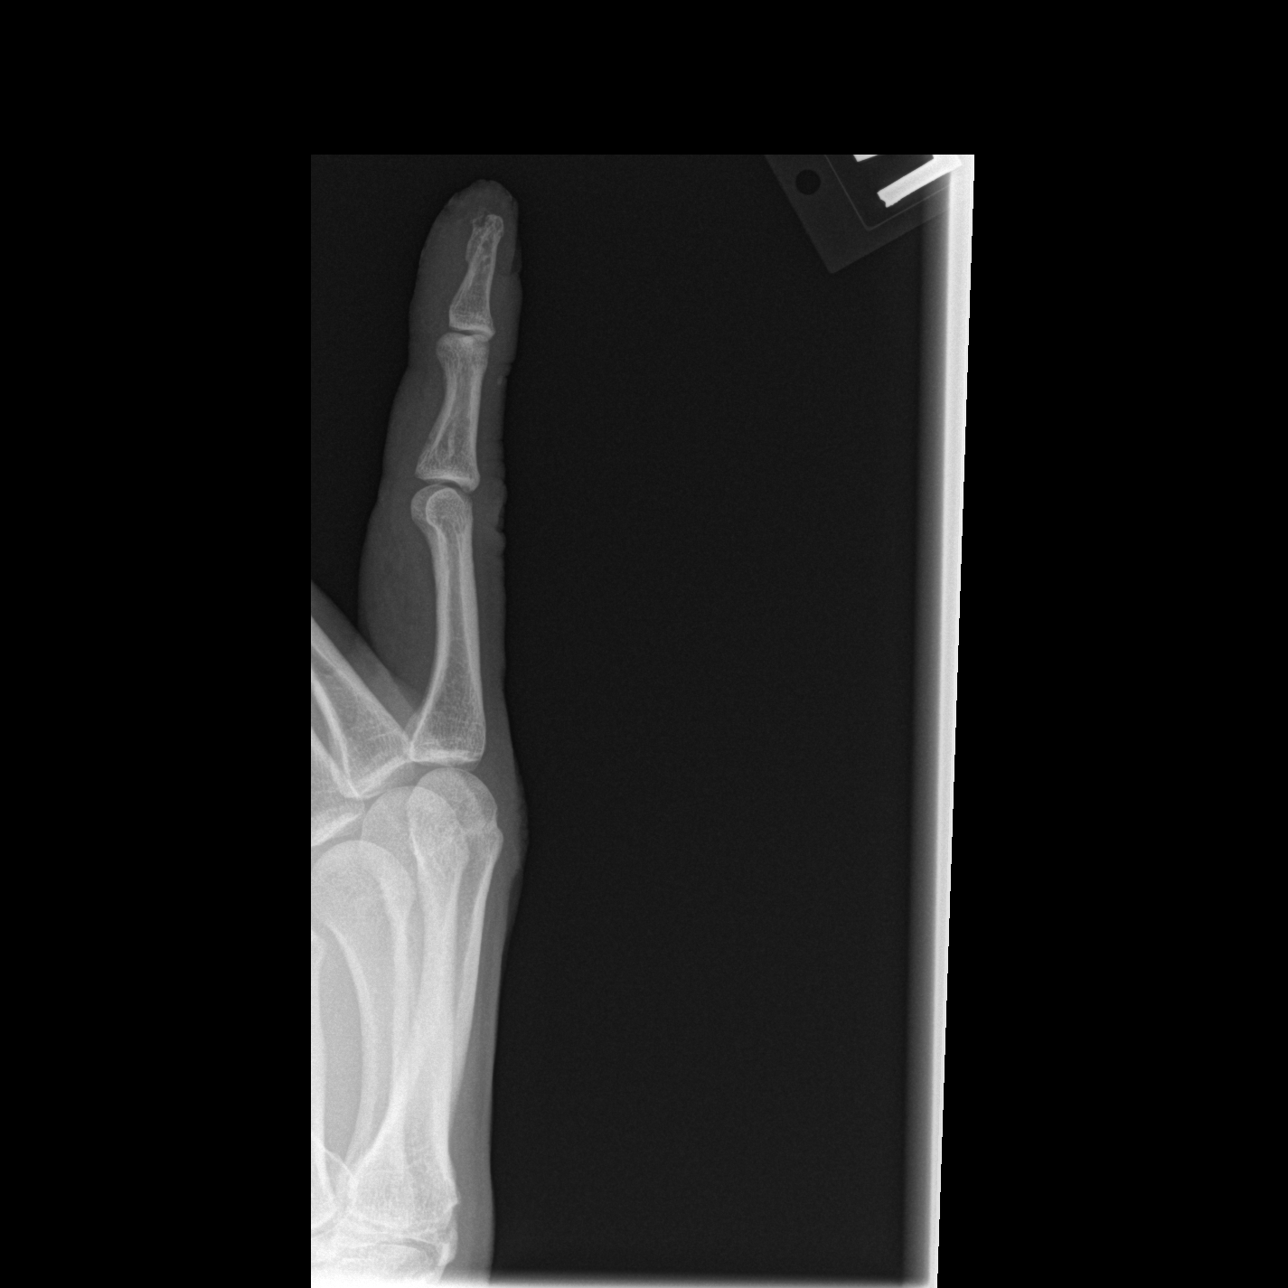

[3 of 3 positions shown; findings below may reference images not displayed]

FINDINGS: There is laceration of the bone and soft tissues at the distal
phalangeal tuft. No metallic foreign body.
IMPRESSION: Soft tissue laceration with underlying bony injury, second distal
phalangeal tuft.

## 2018-12-12 ENCOUNTER — Other Ambulatory Visit: Payer: Self-pay | Admitting: Registered"

## 2018-12-12 DIAGNOSIS — Z20822 Contact with and (suspected) exposure to covid-19: Secondary | ICD-10-CM

## 2018-12-14 LAB — NOVEL CORONAVIRUS, NAA: SARS-CoV-2, NAA: NOT DETECTED

## 2019-01-08 ENCOUNTER — Other Ambulatory Visit: Payer: Self-pay

## 2019-01-08 DIAGNOSIS — Z20822 Contact with and (suspected) exposure to covid-19: Secondary | ICD-10-CM

## 2019-01-09 LAB — NOVEL CORONAVIRUS, NAA: SARS-CoV-2, NAA: NOT DETECTED

## 2019-01-09 LAB — SPECIMEN STATUS REPORT

## 2022-07-25 ENCOUNTER — Emergency Department (HOSPITAL_BASED_OUTPATIENT_CLINIC_OR_DEPARTMENT_OTHER)
Admission: EM | Admit: 2022-07-25 | Discharge: 2022-07-25 | Disposition: A | Discharge status: Home / Self Care | Payer: BC Managed Care – PPO | Attending: Emergency Medicine | Admitting: Emergency Medicine

## 2022-07-25 ENCOUNTER — Other Ambulatory Visit: Payer: Self-pay

## 2022-07-25 ENCOUNTER — Encounter (HOSPITAL_BASED_OUTPATIENT_CLINIC_OR_DEPARTMENT_OTHER): Payer: Self-pay

## 2022-07-25 DIAGNOSIS — R000 Tachycardia, unspecified: Secondary | ICD-10-CM | POA: Insufficient documentation

## 2022-07-25 DIAGNOSIS — X58XXXA Exposure to other specified factors, initial encounter: Secondary | ICD-10-CM | POA: Insufficient documentation

## 2022-07-25 DIAGNOSIS — R21 Rash and other nonspecific skin eruption: Secondary | ICD-10-CM | POA: Insufficient documentation

## 2022-07-25 DIAGNOSIS — T7840XA Allergy, unspecified, initial encounter: Secondary | ICD-10-CM | POA: Insufficient documentation

## 2022-07-25 DIAGNOSIS — Z91018 Allergy to other foods: Secondary | ICD-10-CM | POA: Diagnosis not present

## 2022-07-25 DIAGNOSIS — Z91030 Bee allergy status: Secondary | ICD-10-CM | POA: Insufficient documentation

## 2022-07-25 MED ORDER — FAMOTIDINE IN NACL 20-0.9 MG/50ML-% IV SOLN
20.0000 mg | Freq: Once | INTRAVENOUS | Status: AC
  Administered 2022-07-25: 20 mg via INTRAVENOUS
  Filled 2022-07-25: qty 50

## 2022-07-25 MED ORDER — METHYLPREDNISOLONE SODIUM SUCC 125 MG IJ SOLR
125.0000 mg | Freq: Once | INTRAMUSCULAR | Status: AC
  Administered 2022-07-25: 125 mg via INTRAVENOUS
  Filled 2022-07-25: qty 2

## 2022-07-25 MED ORDER — EPINEPHRINE 0.3 MG/0.3ML IJ SOAJ: 0.3000 mg | INTRAMUSCULAR | 0 refills | Status: AC | PRN

## 2022-07-25 NOTE — Discharge Instructions (Addendum)
Take Benadryl 25 mg every 6 hours for the next 2 days.  Return to the ER if symptoms significantly worsen or change. 

## 2022-07-25 NOTE — ED Triage Notes (Signed)
Pt has given himself two epi injections for an allergic reaction and hives to unknown food/substance. Pt reports las time he needed three epi shots, an epi drip, and benadryl. Pt with generalized itching, sweats, and scratchy throat.

## 2022-07-25 NOTE — ED Provider Notes (Signed)
[facility] Provider Note   CSN: [id number] Arrival date & time: 07/25/22  0144     History  Chief Complaint  Patient presents with   Allergic Reaction    Leslie Langille is a 57 y.o. male.  Patient is a 57 year old male presenting to the emergency department with complaints of an allergic reaction.  He reports a history of allergies to bee stings and pumpkin seeds and carries an EpiPen due to this.  This evening while he was lying in bed, he was woken with itching and irritation all over.  He went and looked in the mirror and noted hives to his face and torso.  He gave himself 2 doses of EpiPen along with Benadryl, then presented here.  He describes a scratchy sensation in his throat, but denies any difficulty breathing or swallowing.  The history is provided by the patient.       Home Medications Prior to Admission medications   Medication Sig Start Date End Date Taking? Authorizing Provider  amoxicillin-clavulanate (AUGMENTIN) 875-125 MG tablet Take 1 tablet by mouth every 12 (twelve) hours. 03/02/15   Eber Hong, MD  atorvastatin (LIPITOR) 20 MG tablet Take 20 mg by mouth daily.    [provider]  bacitracin ointment Apply 1 application topically 2 (two) times daily. 03/02/15   Eber Hong, MD  HYDROcodone-acetaminophen (NORCO/VICODIN) 5-325 MG tablet Take 2 tablets by mouth every 4 (four) hours as needed. 03/02/15   Eber Hong, MD  ibuprofen (ADVIL,MOTRIN) 800 MG tablet Take 1 tablet (800 mg total) by mouth 3 (three) times daily. 03/02/15   Eber Hong, MD  insulin aspart (NOVOLOG) 100 UNIT/ML injection Inject into the skin 3 (three) times daily before meals.    [provider]      Allergies    Bee venom, Other, and Pumpkin seed    Review of Systems   Review of Systems  All other systems reviewed and are negative.   Physical Exam Updated Vital Signs BP (!) 156/99 (BP Location: Right Arm)   Pulse (!)  101   Temp 97.6 F (36.4 C) (Oral)   Resp 20   Ht 5\' 10"  (1.778 m)   Wt 88.5 kg   SpO2 93%   BMI 27.98 kg/m  Physical Exam Vitals and nursing note reviewed.  Constitutional:      General: He is not in acute distress.    Appearance: He is well-developed. He is not diaphoretic.  HENT:     Head: Normocephalic and atraumatic.     Mouth/Throat:     Mouth: Mucous membranes are moist.  Cardiovascular:     Rate and Rhythm: Normal rate and regular rhythm.     Heart sounds: No murmur heard.    No friction rub.  Pulmonary:     Effort: Pulmonary effort is normal. No respiratory distress.     Breath sounds: Normal breath sounds. No stridor. No wheezing or rales.  Abdominal:     General: Bowel sounds are normal. There is no distension.     Palpations: Abdomen is soft.     Tenderness: There is no abdominal tenderness.  Musculoskeletal:        General: Normal range of motion.     Cervical back: Normal range of motion and neck supple.  Skin:    General: Skin is warm and dry.     Findings: Rash present.     Comments: There is an urticarial rash noted to the inside of  both upper arms, chest, and back  Neurological:     Mental Status: He is alert and oriented to person, place, and time.     Coordination: Coordination normal.     ED Results / Procedures / Treatments   Labs (all labs ordered are listed, but only abnormal results are displayed) Labs Reviewed - No data to display  EKG None  Radiology No results found.  Procedures Procedures    Medications Ordered in ED Medications  methylPREDNISolone sodium succinate (SOLU-MEDROL) 125 mg/2 mL injection 125 mg (has no administration in time range)  famotidine (PEPCID) IVPB 20 mg premix (has no administration in time range)    ED Course/ Medical Decision Making/ A&P  Patient is a 57 year old male presenting with an allergic reaction as described in the HPI.  He arrives here with stable vital signs and is clinically  well-appearing.  He does have a generalized urticarial rash noted to the torso and arms.  Patient was given IV Solu-Medrol along with IV Pepcid.  He had already given himself 2 doses of EpiPen and Benadryl prior to coming here.  He has been observed here for several hours and rash seems to be resolving.  He feels much better and I believe can safely be discharged.  Final Clinical Impression(s) / ED Diagnoses Final diagnoses:  None    Rx / DC Orders ED Discharge Orders     None         Geoffery Lyons, MD 07/25/22 [phone]

## 2022-09-27 ENCOUNTER — Encounter: Payer: Self-pay | Admitting: Internal Medicine

## 2022-09-27 ENCOUNTER — Ambulatory Visit (INDEPENDENT_AMBULATORY_CARE_PROVIDER_SITE_OTHER): Payer: BC Managed Care – PPO | Admitting: Internal Medicine

## 2022-09-27 VITALS — BP 124/78 | HR 86 | Temp 97.9°F | Resp 17 | Ht 70.0 in | Wt 200.7 lb

## 2022-09-27 DIAGNOSIS — T782XXA Anaphylactic shock, unspecified, initial encounter: Secondary | ICD-10-CM

## 2022-09-27 DIAGNOSIS — T7800XA Anaphylactic reaction due to unspecified food, initial encounter: Secondary | ICD-10-CM

## 2022-09-27 NOTE — Patient Instructions (Addendum)
Recurrent anaphylaxis   Allergy testing today showed: Positive to grass, weeds, trees, molds, dog, horse, peanut, almond, hazelnut Will get labs to screen of mast cell disorders, alpha gal allergy, stinging insect allergy  Strictly avoid almond, hazelnut, peanut  For now:  Start Zyrtec 10mg  twice daily  Pepcid 20mg  twice daily  Continue to carry 2 epipen on you at all time and follow allergy action plan  Information on xolair given today   Follow up: We will call you with your labs and next steps  Thank you so much for letting me partake in your care today.  Don't hesitate to reach out if you have any additional concerns!  Ferol Luz, MD  [facility] [place]

## 2022-09-27 NOTE — Progress Notes (Unsigned)
New Patient Note  RE: Richard Bernard MRN: [id number] DOB: 02-11-1966 Date of Office Visit: 09/27/2022  Consult requested by: Malka So., MD Primary care provider: Malka So., MD  Chief Complaint: Allergic Reaction (Pt states that he's been waking up in the middle of the night breaking out in rash and welts, most episode was about 6 weeks ago, it happened 3x recently that landed him in the ER. )  History of Present Illness: I had the pleasure of seeing Richard Bernard for initial evaluation at the [facility] on 09/29/2022. He is a 57 y.o. male, who is referred here by Malka So., MD for the evaluation of anaphylaxis .  History obtained from patient, chart review.  Symptoms started 3 years ago.  Reports having episodic allergic reactions which occur mostly on Saturday nights.  Associated with diffuse pruritus, urticaria, nausea, vomiting times associated loss of consciousness.  Has had multiple ER visits during reactions he has required multiple doses most recently on an epinephrine drip.  Unsure if he is eating red meat on nights he reacts.  Initial concern for detergent because he washed her sheets on Saturday denies any association with NSAIDs or alcohol intake.  No exercise related symptoms.  No stings associated with symptoms.  He does report developing urticaria with a bee sting 20 years ago.  ED visit 05/16/22: Medical Decision Making Patient is a 57 year old male with history as above, notable for previous anaphylactic reactions to various insect bites and foods, presents for evaluation of allergic reaction. He awoke from sleep with nausea, vomiting, rash, and then had loss of consciousness. When EMS arrived, he was hypotensive. Patient had administered one of his epinephrine autoinjectors. However, this was apparently expired. EMS gave additional epinephrine 0.3 mg IM. Patient also took Benadryl 50 mg p.o. prior to arrival. Denies any shortness of breath.  On  arrival here, patient has scattered urticaria. His blood pressure is within normal limits. He has no angioedema or ongoing abdominal pain, nausea, vomiting. He did report some chest tightness after giving himself epinephrine.  Differential diagnosis includes anaphylaxis, other allergic reaction, vasovagal hypotension, however based on the constellation of his symptoms I suspect this is an allergic reaction.  EKG was obtained which shows no evidence of acute ischemia. He is in normal sinus rhythm. He was given Pepcid, Solu-Medrol in the ED. He was observed for over 2 and half hours. He continues to do well and his urticaria has completely resolved. He has not had any recurrent symptoms. He is here today with his wife. He is requesting discharge at this time. I have discussed with him the possibility of rebound reaction. I will represcribe to epinephrine autoinjectors. He understands he will need to have these on hand at all times. I will also place him on Pepcid and Benadryl for the next 3 days. We discussed steroids but opted against this given his type 1 diabetes. Overall, he appears stable for discharge at this time. Return precautions discussed in detail." "   Assessment and Plan: Richard Bernard is a 57 y.o. male with: Anaphylaxis, initial encounter - Plan: Chronic Urticaria, Tryptase, Sed Rate (ESR), Alpha-Gal Panel, Thyroid Panel With TSH, CBC With Differential, CMP14+EGFR, Allergen, Pumpkin (f225) IgE, Allergy Test, Allergen Hymenoptera Panel  Allergy with anaphylaxis due to food   Plan: Patient Instructions  Recurrent anaphylaxis   Allergy testing today showed: Positive to grass, weeds, trees, molds, dog, horse, peanut, almond, hazelnut Will get labs to screen of mast  cell disorders, alpha gal allergy, stinging insect allergy  Strictly avoid almond, hazelnut, peanut  For now:  Start Zyrtec 10mg  twice daily  Pepcid 20mg  twice daily  Continue to carry 2 epipen on you at all time and follow  allergy action plan  Information on xolair given today   Follow up: We will call you with your labs and next steps  Thank you so much for letting me partake in your care today.  Don't hesitate to reach out if you have any additional concerns!  Ferol Luz, MD  [facility] [place] [place]   No orders of the defined types were placed in this encounter.  Lab Orders         Chronic Urticaria         Tryptase         Sed Rate (ESR)         Alpha-Gal Panel         Thyroid Panel With TSH         CBC With Differential         CMP14+EGFR         Allergen, Pumpkin (f225) IgE         Allergen Hymenoptera Panel      Other allergy screening: Asthma: no Rhino conjunctivitis: no Food allergy: yes reports a history of pumpkin seed allergy.  Cannot tolerate pumpkin. Medication allergy: no Hymenoptera allergy: no Urticaria: no Eczema:no History of recurrent infections suggestive of immunodeficency: no  Diagnostics:  Skin Testing: Environmental allergy panel and select foods.   adequate controls  Results interpreted by myself and discussed with patient/family.  Airborne Adult Perc - 09/27/22 1455     Time Antigen Placed 0230    Allergen Manufacturer Waynette Buttery    Location Back    Number of Test 55    Panel 1 Select    1. Control-Buffer 50% Glycerol Negative    2. Control-Histamine 4+    3. Bahia Negative    4. French Southern Territ[place] 4+    5. Johnson Negative    6. Kentucky Blue 4+    7. Meadow Fescue 3+    8. Perennial Rye 4+    9. Timothy Negative    10. Ragweed Mix Negative    11. Cocklebur Negative    12. Plantain,  English Negative    13. Baccharis Negative    14. Dog Fennel Negative    15. Russian Thistle 3+    16. Lamb's Quarters 3+    17. Sheep Sorrell 3+    18. Rough Pigweed Negative    19. Marsh Elder, Rough Negative    20. Mugwort, Common Negative    21. Box, Elder Negative    22. Cedar, red 3+    23. Sweet Gum Negative    24. Pecan Pollen Negative    25.  Pine Mix Negative    26. Walnut, Black Pollen Negative    27. Red Mulberry Negative    28. Ash Mix Negative    29. Birch Mix Negative    30. Beech American 3+    31. Cottonwood, Eastern 3+    32. Hickory, White 3+    33. Maple Mix Negative    34. Oak, Guinea-Bissau Mix Negative    35. Sycamore Eastern Negative    36. Alternaria Alternata Negative    37. Cladosporium Herbarum Negative    38. Aspergillus Mix Negative    39. Penicillium Mix 3+    40. Bipolaris Sorokiniana (Helminthosporium) 3+  41. Drechslera Spicifera (Curvularia) 3+    42. Mucor Plumbeus 3+    43. Fusarium Moniliforme Negative    44. Aureobasidium Pullulans (pullulara) Negative    45. Rhizopus Oryzae Negative    46. Botrytis Cinera 3+    47. Epicoccum Nigrum 3+    48. Phoma Betae Negative    49. Dust Mite Mix Negative    50. Cat Hair 10,000 BAU/ml Negative    51.  Dog Epithelia 4+    52. Mixed Feathers Negative    53. Horse Epithelia 4+    54. Cockroach, German Negative    55. Tobacco Leaf Negative             13 Food Perc - 09/27/22 1456       Test Information   Time Antigen Placed 0230    Allergen Manufacturer Waynette Buttery    Location Back    Number of allergen test 13    Food Select      Food   1. Peanut 4+    2. Soybean Negative    3. Wheat Negative    4. Sesame Negative    5. Milk, Cow Negative    6. Casein Negative    7. Egg White, Chicken Negative    8. Shellfish Mix Negative    9. Fish Mix Negative    10. Cashew Negative    11. Walnut Food Negative    12. Almond 3+    13. Hazelnut 4+             Past Medical History: There are no problems to display for this patient.  Past Medical History:  Diagnosis Date   Diabetes mellitus without complication (HCC)    Hypercholesterolemia    Past Surgical History: Past Surgical History:  Procedure Laterality Date   JOINT REPLACEMENT     Medication List:  Current Outpatient Medications  Medication Sig Dispense Refill   EPINEPHrine 0.3  mg/0.3 mL IJ SOAJ injection Inject 0.3 mg into the muscle as needed for anaphylaxis. 1 each 0   HYDROcodone-acetaminophen (NORCO/VICODIN) 5-325 MG tablet Take 2 tablets by mouth every 4 (four) hours as needed. 10 tablet 0   ibuprofen (ADVIL,MOTRIN) 800 MG tablet Take 1 tablet (800 mg total) by mouth 3 (three) times daily. 21 tablet 0   insulin aspart (NOVOLOG) 100 UNIT/ML injection Inject into the skin 3 (three) times daily before meals.     amoxicillin-clavulanate (AUGMENTIN) 875-125 MG tablet Take 1 tablet by mouth every 12 (twelve) hours. (Patient not taking: Reported on 09/27/2022) 14 tablet 0   atorvastatin (LIPITOR) 20 MG tablet Take 20 mg by mouth daily. (Patient not taking: Reported on 09/27/2022)     bacitracin ointment Apply 1 application topically 2 (two) times daily. (Patient not taking: Reported on 09/27/2022) 120 g 0   No current facility-administered medications for this visit.   Allergies: Allergies  Allergen Reactions   Bee Venom Anaphylaxis   Other Anaphylaxis   Pumpkin Seed Anaphylaxis   Social History: Social History   Socioeconomic History   Marital status: Married    Spouse name: Not on file   Number of children: Not on file   Years of education: Not on file   Highest education level: Not on file  Occupational History   Not on file  Tobacco Use   Smoking status: Never    Passive exposure: Never   Smokeless tobacco: Not on file  Vaping Use   Vaping Use: Never used  Substance and Sexual Activity  Alcohol use: No   Drug use: No   Sexual activity: Yes    Birth control/protection: None  Other Topics Concern   Not on file  Social History Narrative   Not on file   Social Determinants of Health   Financial Resource Strain: Not on file  Food Insecurity: Not on file  Transportation Needs: Not on file  Physical Activity: Not on file  Stress: Not on file  Social Connections: Not on file   Lives in a single-family home.  No roaches in the house and best to get  off the floor.  Not exposed to fumes, chemicals or dust.  No HEPA filter in the home and home is not near an interstate industrial area. Smoking: No exposure Occupation: Works as a Fish farm manager HistorySurveyor, minerals in the house: no Engineer, civil (consulting) in the family room: no Carpet in the bedroom: no Heating: gas Cooling: central Pet: yes cats with access to bedroom  Family History: No family history on file.   ROS: All others negative except as noted per HPI.   Objective: BP 124/78   Pulse 86   Temp 97.9 F (36.6 C) (Temporal)   Resp 17   Ht 5\' 10"  (1.778 m)   Wt 200 lb 11.2 oz (91 kg)   SpO2 96%   BMI 28.80 kg/m  Body mass index is 28.8 kg/m.  General Appearance:  Alert, cooperative, no distress, appears stated age  Head:  Normocephalic, without obvious abnormality, atraumatic  Eyes:  Conjunctiva clear, EOM's intact  Nose: Nares normal, normal mucosa, no visible anterior polyps, and septum midline  Throat: Lips, tongue normal; teeth and gums normal, normal posterior oropharynx  Neck: Supple, symmetrical  Lungs:   clear to auscultation bilaterally, Respirations unlabored, no coughing  Heart:  regular rate and rhythm and no murmur, Appears well perfused  Extremities: No edema  Skin: Skin color, texture, turgor normal, no rashes or lesions on visualized portions of skin  Neurologic: No gross deficits   The plan was reviewed with the patient/family, and all questions/concerned were addressed.  It was my pleasure to see Dawuan today and participate in his care. Please feel free to contact me with any questions or concerns.  Sincerely,  Ferol Luz, MD Allergy & Immunology  [facility] office: [phone] [facility] office: [phone]

## 2022-10-06 ENCOUNTER — Telehealth: Payer: Self-pay | Admitting: Internal Medicine

## 2022-10-06 NOTE — Telephone Encounter (Signed)
Patient spouse called to get test results back and requested a call back.

## 2022-10-06 NOTE — Telephone Encounter (Signed)
Pt was wanting to know results but we have 1 still pending informed him we will call once we have all results he said call wife as they are in [place] on vacation and dont have good service

## 2022-10-08 LAB — CBC WITH DIFFERENTIAL
Basophils Absolute: 0 10*3/uL (ref 0.0–0.2)
Basos: 1 %
EOS (ABSOLUTE): 0.2 10*3/uL (ref 0.0–0.4)
Eos: 2 %
Hematocrit: 44.5 % (ref 37.5–51.0)
Hemoglobin: 14.9 g/dL (ref 13.0–17.7)
Immature Grans (Abs): 0 10*3/uL (ref 0.0–0.1)
Immature Granulocytes: 0 %
Lymphocytes Absolute: 2.2 10*3/uL (ref 0.7–3.1)
Lymphs: 25 %
MCH: 30.2 pg (ref 26.6–33.0)
MCHC: 33.5 g/dL (ref 31.5–35.7)
MCV: 90 fL (ref 79–97)
Monocytes Absolute: 0.4 10*3/uL (ref 0.1–0.9)
Monocytes: 5 %
Neutrophils Absolute: 5.9 10*3/uL (ref 1.4–7.0)
Neutrophils: 67 %
RBC: 4.93 x10E6/uL (ref 4.14–5.80)
RDW: 12.1 % (ref 11.6–15.4)
WBC: 8.7 10*3/uL (ref 3.4–10.8)

## 2022-10-08 LAB — ALLERGEN HYMENOPTERA PANEL
Bumblebee: <0.1 kU/L
Honeybee IgE: <0.1 kU/L
Hornet, White Face, IgE: 0.86 kU/L — AB
Hornet, Yellow, IgE: 0.21 kU/L — AB
Paper Wasp IgE: 1.61 kU/L — AB
Yellow Jacket, IgE: 1.69 kU/L — AB

## 2022-10-08 LAB — CHRONIC URTICARIA: cu index: >50 — ABNORMAL HIGH (ref <10)

## 2022-10-08 LAB — THYROID PANEL WITH TSH
Free Thyroxine Index: 1.6 (ref 1.2–4.9)
T3 Uptake Ratio: 25 % (ref 24–39)
T4, Total: 6.4 ug/dL (ref 4.5–12.0)
TSH: 0.826 u[IU]/mL (ref 0.450–4.500)

## 2022-10-08 LAB — ALPHA-GAL PANEL
Allergen Lamb IgE: 13.7 kU/L — AB
Beef IgE: 26.6 kU/L — AB
IgE (Immunoglobulin E), Serum: 528 IU/mL — ABNORMAL HIGH (ref 6–495)
O215-IgE Alpha-Gal: >100 kU/L — AB
Pork IgE: 9.76 kU/L — AB

## 2022-10-08 LAB — CMP14+EGFR
ALT: 17 IU/L (ref 0–44)
AST: 17 IU/L (ref 0–40)
Albumin: 4.3 g/dL (ref 3.8–4.9)
Alkaline Phosphatase: 82 IU/L (ref 44–121)
BUN/Creatinine Ratio: 15 (ref 9–20)
BUN: 18 mg/dL (ref 6–24)
Bilirubin Total: 0.5 mg/dL (ref 0.0–1.2)
CO2: 23 mmol/L (ref 20–29)
Calcium: 9.2 mg/dL (ref 8.7–10.2)
Chloride: 101 mmol/L (ref 96–106)
Creatinine, Ser: 1.22 mg/dL (ref 0.76–1.27)
Globulin, Total: 1.9 g/dL (ref 1.5–4.5)
Glucose: 321 mg/dL — ABNORMAL HIGH (ref 70–99)
Potassium: 4.5 mmol/L (ref 3.5–5.2)
Sodium: 140 mmol/L (ref 134–144)
Total Protein: 6.2 g/dL (ref 6.0–8.5)
eGFR: 70 mL/min/{1.73_m2} (ref >59)

## 2022-10-08 LAB — TRYPTASE: Tryptase: 9.7 ug/L (ref 2.2–13.2)

## 2022-10-08 LAB — ALLERGEN, PUMPKIN (F225) IGE: Pumpkin IgE: 0.17 kU/L — AB

## 2022-10-08 LAB — SEDIMENTATION RATE: Sed Rate: 10 mm/hr (ref 0–30)

## 2022-10-08 NOTE — Progress Notes (Signed)
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  Recommend strict avoidance of mammalian meat an all forms of gelatin.   He was still positive to pumpkin seed, hymenoptera (bee)   Otherwise labs were normal.  Can someone let patient know? Thanks!
